# Patient Record
Sex: Male | Born: 2000 | Race: White | Hispanic: No | Smoking: Never smoker
Health system: Southern US, Community
[De-identification: ages and names within clinical notes are randomized; demographics above are authoritative.]

## PROBLEM LIST (undated history)

## (undated) DIAGNOSIS — F909 Attention-deficit hyperactivity disorder, unspecified type: Secondary | ICD-10-CM

## (undated) DIAGNOSIS — IMO0001 Reserved for inherently not codable concepts without codable children: Secondary | ICD-10-CM

## (undated) DIAGNOSIS — Z464 Encounter for fitting and adjustment of orthodontic device: Secondary | ICD-10-CM

## (undated) HISTORY — PX: DENTAL REHABILITATION: SHX1449

## (undated) HISTORY — PX: CIRCUMCISION: SHX1350

---
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
## 2022-07-28 ENCOUNTER — Encounter: Payer: Self-pay | Admitting: Family

## 2022-07-28 ENCOUNTER — Ambulatory Visit (INDEPENDENT_AMBULATORY_CARE_PROVIDER_SITE_OTHER): Payer: Medicaid Other | Admitting: Family

## 2022-07-28 VITALS — BP 110/62 | HR 98 | Resp 18 | Ht 68.0 in | Wt 223.0 lb

## 2022-07-28 DIAGNOSIS — F902 Attention-deficit hyperactivity disorder, combined type: Secondary | ICD-10-CM

## 2022-07-28 DIAGNOSIS — H547 Unspecified visual loss: Secondary | ICD-10-CM | POA: Insufficient documentation

## 2022-07-28 DIAGNOSIS — Z973 Presence of spectacles and contact lenses: Secondary | ICD-10-CM

## 2022-07-28 DIAGNOSIS — Z13220 Encounter for screening for lipoid disorders: Secondary | ICD-10-CM | POA: Diagnosis not present

## 2022-07-28 DIAGNOSIS — Z0000 Encounter for general adult medical examination without abnormal findings: Secondary | ICD-10-CM

## 2022-07-28 LAB — BASIC METABOLIC PANEL
BUN: 10 mg/dL (ref 6–23)
CO2: 30 mEq/L (ref 19–32)
Calcium: 10.3 mg/dL (ref 8.4–10.5)
Chloride: 103 mEq/L (ref 96–112)
Creatinine, Ser: 0.93 mg/dL (ref 0.40–1.50)
GFR: 118.94 mL/min (ref >60.00)
Glucose, Bld: 86 mg/dL (ref 70–99)
Potassium: 4.1 mEq/L (ref 3.5–5.1)
Sodium: 140 mEq/L (ref 135–145)

## 2022-07-28 LAB — LIPID PANEL
Cholesterol: 157 mg/dL (ref 0–200)
HDL: 55.8 mg/dL (ref >39.00)
LDL Cholesterol: 93 mg/dL (ref 0–99)
NonHDL: 101.5
Total CHOL/HDL Ratio: 3
Triglycerides: 45 mg/dL (ref 0.0–149.0)
VLDL: 9 mg/dL (ref 0.0–40.0)

## 2022-07-28 LAB — CBC
HCT: 43.2 % (ref 36.0–49.0)
Hemoglobin: 14.8 g/dL (ref 12.0–16.0)
MCHC: 34.4 g/dL (ref 31.0–37.0)
MCV: 90.3 fl (ref 78.0–98.0)
Platelets: 249 K/uL (ref 150.0–575.0)
RBC: 4.78 Mil/uL (ref 3.80–5.70)
RDW: 13.2 % (ref 11.4–15.5)
WBC: 7.7 K/uL (ref 4.5–13.5)

## 2022-07-28 NOTE — Assessment & Plan Note (Signed)
Maintain annual appt with optometry  Referral placed

## 2022-07-28 NOTE — Assessment & Plan Note (Signed)
Stable controlled without medication per pt

## 2022-07-28 NOTE — Patient Instructions (Addendum)
Welcome to our clinic, I am happy to have you as my new patient. I am excited to continue on this healthcare journey with you.  ------------------------------------ Recommend seeing dentist for regular screenings   ------------------------------------  Stop by the lab prior to leaving today. I will notify you of your results once received.   Please keep in mind Any my chart messages you send have up to a three business day turnaround for a response.  Phone calls may take up to a one full business day turnaround for a  response.   If you need a medication refill I recommend you request it through the pharmacy as this is easiest for [place] rather than sending a message and or phone call.   Due to recent changes in healthcare laws, you may see results of your imaging and/or laboratory studies on MyChart before I have had a chance to review them.  I understand that in some cases there may be results that are confusing or concerning to you. Please understand that not all results are received at the same time and often I may need to interpret multiple results in order to provide you with the best plan of care or course of treatment. Therefore, I ask that you please give me 2 business days to thoroughly review all your results before contacting my office for clarification. Should we see a critical lab result, you will be contacted sooner.   It was a pleasure seeing you today! Please do not hesitate to reach out with any questions and or concerns.  Regards,   Mort Sawyers FNP-C

## 2022-07-28 NOTE — Progress Notes (Signed)
New Patient Office Visit  Subjective:  Patient ID: Brett Freeman, male    DOB: [birth year]  Age: 21 y.o. MRN: [id number]  CC: No chief complaint on file.   HPI Brett Freeman is here to establish care as a new patient.  Prior provider was: Dr. Erma Pinto , pediatrician  Pt is without acute concerns.  . Flu vaccine: declines  Men B: declines  Td up to date.  Dentist, over one year ago, overdue.   chronic concerns:  ADHD: focalin in the past, however not taking anymore. Works as a Furniture conservator/restorer at work, sometimes gets easily distracted however while working. Doesn't seem to affect his job.    H/o visual hallucinations, however decreased dose of focalin and hallucinations  Went away. This was in 2014.   H/o chest pain, ech with normal findings.   Riding in the rodeo,chronic pain from riding, so he smokes marijuana, as been smoking for the last one year. Denies cough.    Past Medical History:  Diagnosis Date   ADHD (attention deficit hyperactivity disorder)    Orthodontics    braces    Past Surgical History:  Procedure Laterality Date   CIRCUMCISION     CLOSED REDUCTION WRIST FRACTURE Right 12/11/2017   Procedure: CLOSED REDUCTION WRIST;  Surgeon: Leim Fabry, MD;  Location: [facility];  Service: Orthopedics;  Laterality: Right;   DENTAL REHABILITATION     age 36 or 58    Family History  Problem Relation Age of Onset   Depression Paternal Uncle    Migraines Paternal Grandfather    Autism Cousin        Maternal 2nd Cousin    Social History   Socioeconomic History   Marital status: Significant Other    Spouse name: Not on file   Number of children: Not on file   Years of education: Not on file   Highest education level: Not on file  Occupational History   Occupation: Furniture conservator/restorer  Tobacco Use   Smoking status: Never    Passive exposure: Yes   Smokeless tobacco: Never  Vaping Use   Vaping Use: Every day   Substances: Nicotine  Substance and  Sexual Activity   Alcohol use: No   Drug use: Yes    Types: Marijuana    Comment: tried mushrooms in the past   Sexual activity: Yes    Partners: Female    Birth control/protection: Coitus interruptus    Comment: engaged, partner has nexplanon  Other Topics Concern   Not on file  Social History Narrative   Not on file   Social Determinants of Health   Financial Resource Strain: Not on file  Food Insecurity: Not on file  Transportation Needs: Not on file  Physical Activity: Not on file  Stress: Not on file  Social Connections: Not on file  Intimate Partner Violence: Not on file    Outpatient Medications Prior to Visit  Medication Sig Dispense Refill   acetaminophen (TYLENOL) 325 MG tablet Take 650 mg by mouth every 6 (six) hours as needed.     dexmethylphenidate (FOCALIN) 10 MG tablet Take 10 mg by mouth daily.     dexmethylphenidate (FOCALIN) 2.5 MG tablet Take 2.5 mg by mouth daily.     ondansetron (ZOFRAN ODT) 4 MG disintegrating tablet Take 1 tablet (4 mg total) by mouth every 8 (eight) hours as needed for nausea or vomiting. 20 tablet 0   No facility-administered medications prior to visit.    No  Known Allergies  ROS Review of Systems  Review of Systems  Respiratory:  Negative for shortness of breath.   Cardiovascular:  Negative for chest pain and palpitations.  Gastrointestinal:  Negative for constipation and diarrhea.  Genitourinary:  Negative for dysuria, frequency and urgency.  Musculoskeletal:  Negative for myalgias.  Psychiatric/Behavioral:  Negative for depression and suicidal ideas.   All other systems reviewed and are negative.    Objective:    Physical Exam  Gen: NAD, resting comfortably CV: RRR with no murmurs appreciated Pulm: NWOB, CTAB with no crackles, wheezes, or rhonchi Skin: warm, dry Psych: Normal affect and thought content  BP 110/62   Pulse 98   Resp 18   Ht _0  (1.727 m)   Wt 223 lb (101.2 kg)   SpO2 95%   BMI 33.91 kg/m   Wt Readings from Last 3 Encounters:  07/28/22 223 lb (101.2 kg) (97 %, Z= 1.93)*  12/11/17 164 lb (74.4 kg) (92 %, Z= 1.40)*  12/08/17 166 lb 0.1 oz (75.3 kg) (93 %, Z= 1.46)*   * Growth percentiles are based on CDC (Boys, 2-20 Years) data.     Health Maintenance Due  Topic Date Due   HIV Screening  Never done   Hepatitis C Screening  Never done    There are no preventive care reminders to display for this patient.   No results found for: "TSH" No results found for: "WBC", "HGB", "HCT", "MCV", "PLT" No results found for: "NA", "K", "CHLORIDE", "CO2", "GLUCOSE", "BUN", "CREATININE", "BILITOT", "ALKPHOS", "AST", "ALT", "PROT", "ALBUMIN", "CALCIUM", "ANIONGAP", "EGFR", "GFR" No results found for: "CHOL" No results found for: "HDL" No results found for: "LDLCALC" No results found for: "TRIG" No results found for: "CHOLHDL" No results found for: "HGBA1C"    Assessment & Plan:   Problem List Items Addressed This Visit       Other   ADHD (attention deficit hyperactivity disorder)    Stable controlled without medication per pt      Visual loss   Relevant Orders   Ambulatory referral to Optometry   Wears glasses - Primary    Maintain annual appt with optometry  Referral placed      Relevant Orders   Ambulatory referral to Optometry   Encounter for general adult medical examination without abnormal findings    Patient Counseling(The following topics were reviewed):  Preventative care handout given to pt  Health maintenance and immunizations reviewed. Please refer to Health maintenance section. Pt advised on safe sex, wearing seatbelts in car, and proper nutrition labwork ordered today for annual Dental health: Discussed importance of regular tooth brushing, flossing, and dental visits.  Recommend dental screenings make appt       Relevant Orders   Lipid panel   Basic metabolic panel   CBC   Other Visit Diagnoses     Lipid screening       Relevant Orders    Lipid panel       No orders of the defined types were placed in this encounter.   Follow-up: Return in about 1 year (around 07/29/2023) for f/u CPE.    Eugenia Pancoast, FNP

## 2022-07-28 NOTE — Assessment & Plan Note (Signed)
Patient Counseling(The following topics were reviewed):  Preventative care handout given to pt  Health maintenance and immunizations reviewed. Please refer to Health maintenance section. Pt advised on safe sex, wearing seatbelts in car, and proper nutrition labwork ordered today for annual Dental health: Discussed importance of regular tooth brushing, flossing, and dental visits.  Recommend dental screenings make appt

## 2022-07-31 NOTE — Progress Notes (Signed)
Cholesterol great, keep up with low cholesterol diet.  No anemia. Labs unremarkable.

## 2022-12-07 ENCOUNTER — Telehealth: Payer: Medicaid Other | Admitting: Physician Assistant

## 2022-12-07 DIAGNOSIS — A084 Viral intestinal infection, unspecified: Secondary | ICD-10-CM

## 2022-12-07 MED ORDER — ONDANSETRON 4 MG PO TBDP: 4.0000 mg | ORAL_TABLET | Freq: Three times a day (TID) | ORAL | 0 refills | Status: DC | PRN

## 2022-12-07 NOTE — Patient Instructions (Signed)
Brett Freeman, thank you for joining [name], PA-C for today's virtual visit.  While this provider is not your primary care provider (PCP), if your PCP is located in our provider database this encounter information will be shared with them immediately following your visit.   [facility] account gives you access to today's visit and all your visits, tests, and labs performed at [facility] " click here if you don't have a [facility] account or go to [email or web address]  Consent: (Patient) Brett Freeman provided verbal consent for this virtual visit at the beginning of the encounter.  Current Medications: No current outpatient medications on file.   Medications ordered in this encounter:  No orders of the defined types were placed in this encounter.    *If you need refills on other medications prior to your next appointment, please contact your pharmacy*  Follow-Up: Call back or seek an in-person evaluation if the symptoms worsen or if the condition fails to improve as anticipated.  [place] [phone]  Other Instructions Please keep hydrated -- sips of water, ginger ale, pedialyte. When you feel like eating, follow diet recommendations below. Ok to use OTC Imodium. The Zofran is to use as directed for nausea/vomiting. If not resolving or any worsening symptoms despite treatment, please seek an in-person evaluation.   Food Choices to Help Relieve Diarrhea, Adult Diarrhea can make you feel weak and cause you to become dehydrated. Dehydration is a condition in which there is not enough water or other fluids in the body. It is important to choose the right foods and drinks to: Relieve diarrhea. Replace lost fluids and nutrients. Prevent dehydration. What are tips for following this plan? Relieving diarrhea Avoid foods that make your diarrhea worse. These may include: Foods and drinks that are sweetened  with high-fructose corn syrup, honey, or sweeteners such as xylitol, sorbitol, and mannitol. Check food labels for these ingredients. Fried, greasy, or spicy foods. Raw fruits and vegetables. Eat foods that are rich in probiotics. These include foods such as yogurt and fermented milk products. Probiotics can help increase healthy bacteria in your stomach and intestines (gastrointestinal or GI tract). This may help digestion and stop diarrhea. If you have lactose intolerance, avoid dairy products. These may make your diarrhea worse. Take medicine to help stop diarrhea only as told by your health care provider. Replacing nutrients  Eat bland, easy-to-digest foods in small amounts as you are able, until your diarrhea starts to get better. These foods include bananas, applesauce, rice, toast, and crackers. Over time, add nutrient-rich foods as your body tolerates them or as told by your health care provider. These include: Well-cooked protein foods, such as eggs, lean meats like fish or chicken without skin, and tofu. Peeled, seeded, and soft-cooked fruits and vegetables. Low-fat dairy products. Whole grains. Take vitamin and mineral supplements as told by your health care provider. Preventing dehydration  Start by sipping water or a solution to prevent dehydration (oral rehydration solution, or ORS). This is a drink that helps replace fluids and minerals your body has lost. You can buy an ORS at pharmacies and retail stores. Try to drink at least 8-10 cups (2,000-2,500 mL) of fluid each day to help replace lost fluids. If your urine is pale yellow, you are getting enough fluids. You may drink other liquids in addition to water, such as fruit juice that you have added water to (diluted fruit juice) or low-calorie sports drinks, as tolerated  or as told by your health care provider. Avoid drinks with caffeine, such as coffee, tea, or soft drinks. Avoid alcohol. This information is not intended to  replace advice given to you by your health care provider. Make sure you discuss any questions you have with your health care provider. Document Revised: 02/14/2022 Document Reviewed: 02/14/2022 Elsevier Patient Education  [place].    If you have been instructed to have an in-person evaluation today at a local Urgent Care facility, please use the link below. It will take you to a list of all of our available [facility], including address, phone number and hours of operation. Please do not delay care.  [facility]  If you or a family member do not have a primary care provider, use the link below to schedule a visit and establish care. When you choose a [facility] primary care physician or advanced practice provider, you gain a long-term partner in health. Find a Primary Care Provider  Learn more about 's in-office and virtual care options: [facility]

## 2022-12-07 NOTE — Progress Notes (Signed)
Virtual Visit Consent   Brett Freeman, you are scheduled for a virtual visit with a [facility] provider today. Just as with appointments in the office, your consent must be obtained to participate. Your consent will be active for this visit and any virtual visit you may have with one of our providers in the next 365 days. If you have a MyChart account, a copy of this consent can be sent to you electronically.  As this is a virtual visit, video technology does not allow for your provider to perform a traditional examination. This may limit your provider's ability to fully assess your condition. If your provider identifies any concerns that need to be evaluated in person or the need to arrange testing (such as labs, EKG, etc.), we will make arrangements to do so. Although advances in technology are sophisticated, we cannot ensure that it will always work on either your end or our end. If the connection with a video visit is poor, the visit may have to be switched to a telephone visit. With either a video or telephone visit, we are not always able to ensure that we have a secure connection.  By engaging in this virtual visit, you consent to the provision of healthcare and authorize for your insurance to be billed (if applicable) for the services provided during this visit. Depending on your insurance coverage, you may receive a charge related to this service.  I need to obtain your verbal consent now. Are you willing to proceed with your visit today? [name] has provided verbal consent on 12/07/2022 for a virtual visit (video or telephone). [name], [place]  Date: 12/07/2022 10:39 AM  Virtual Visit via Video Note   I, [name], connected with  Brett Freeman  ([id number], 2001-05-10) on 12/07/22 at  9:30 AM EDT by a video-enabled telemedicine application and verified that I am speaking with the correct person using two identifiers.  Location: Patient: Virtual Visit  Location Patient: Home Provider: Virtual Visit Location Provider: Home Office   I discussed the limitations of evaluation and management by telemedicine and the availability of in person appointments. The patient expressed understanding and agreed to proceed.    History of Present Illness: Brett Freeman is a 22 y.o. who identifies as a male who was assigned male at birth, and is being seen today for GI symptoms starting this morning with diarrhea, emesis x 2 (non-bloody). Denies melena, hematochezia or tenesmus. Denies fever. Some chills noted. Some mild headache this morning. Felt fine at bedtime last night. Denies recent travel or sick contact.   HPI: HPI  Problems:  Patient Active Problem List   Diagnosis Date Noted   Visual loss 07/28/2022   Wears glasses 07/28/2022   Encounter for general adult medical examination without abnormal findings 07/28/2022   ADHD (attention deficit hyperactivity disorder) 05/21/2013    Allergies: No Known Allergies Medications:  Current Outpatient Medications:    ondansetron (ZOFRAN-ODT) 4 MG disintegrating tablet, Take 1 tablet (4 mg total) by mouth every 8 (eight) hours as needed for nausea or vomiting., Disp: 20 tablet, Rfl: 0  Observations/Objective: Patient is well-developed, well-nourished in no acute distress.  Resting comfortably at home.  Head is normocephalic, atraumatic.  No labored breathing. Speech is clear and coherent with logical content.  Patient is alert and oriented at baseline.  Assessment and Plan: 1. Viral gastroenteritis - ondansetron (ZOFRAN-ODT) 4 MG disintegrating tablet; Take 1 tablet (4 mg total) by mouth every 8 (  eight) hours as needed for nausea or vomiting.  Dispense: 20 tablet; Refill: 0  Supportive measures and OTC medications reviewed. BRAT diet started. Zofran per orders. Ok to start Imodium. Work note provided. Follow-up in person if not resolving. ER precautions reviewed.   Follow Up Instructions: I  discussed the assessment and treatment plan with the patient. The patient was provided an opportunity to ask questions and all were answered. The patient agreed with the plan and demonstrated an understanding of the instructions.  A copy of instructions were sent to the patient via MyChart unless otherwise noted below.   The patient was advised to call back or seek an in-person evaluation if the symptoms worsen or if the condition fails to improve as anticipated.  Time:  I spent 10 minutes with the patient via telehealth technology discussing the above problems/concerns.    [name], PA-C

## 2023-05-04 ENCOUNTER — Encounter: Payer: Self-pay | Admitting: Family Medicine

## 2023-05-04 ENCOUNTER — Ambulatory Visit: Payer: Medicaid Other | Admitting: Family Medicine

## 2023-05-04 VITALS — BP 100/60 | HR 74 | Temp 98.1°F | Ht 68.5 in | Wt 230.5 lb

## 2023-05-04 DIAGNOSIS — M545 Low back pain, unspecified: Secondary | ICD-10-CM

## 2023-05-04 MED ORDER — CYCLOBENZAPRINE HCL 10 MG PO TABS: 10.0000 mg | ORAL_TABLET | Freq: Every evening | ORAL | 0 refills | Status: AC | PRN

## 2023-05-04 MED ORDER — DICLOFENAC SODIUM 75 MG PO TBEC: 75.0000 mg | DELAYED_RELEASE_TABLET | Freq: Two times a day (BID) | ORAL | 0 refills | Status: DC

## 2023-05-04 NOTE — Assessment & Plan Note (Signed)
Acute, paraspinous muscle strain No red flags or indication for imaging. Treat with heat, home physical therapy, massage.  Start anti-inflammatory diclofenac 75 mg p.o. twice daily.  Use muscle relaxant Flexeril 10 mg p.o. nightly as needed muscle spasm.  Return and ER precautions provided.  Follow-up if not improving as expected after 2 to 4 weeks. Encouraged strengthening of core to prevent further back issues.

## 2023-05-04 NOTE — Patient Instructions (Signed)
Heat and  massage on low/ mid back  Start diclofenac twice daily for inflammation. Can use muscle relaxant at night for spasm.  Start home physical therapy

## 2023-05-04 NOTE — Progress Notes (Signed)
Patient ID: JUMAANE DEMERY, male    DOB: 2001/07/07, 22 y.o.   MRN: [id number]  This visit was conducted in person.  BP 100/60 (BP Location: Left Arm, Patient Position: Sitting, Cuff Size: Large)   Pulse 74   Temp 98.1 F (36.7 C) (Temporal)   Ht 5' 8.5" (1.74 m)   Wt 230 lb 8 oz (104.6 kg)   SpO2 99%   BMI 34.54 kg/m    CC:  Chief Complaint  Patient presents with   Back Pain    Lower-Started on Sunday    Subjective:   HPI: Brett Freeman is a 22 y.o. male presenting on 05/04/2023 for Back Pain (Lower-Started on Sunday)  New onset low back pain x 5 days.  Woke up with  low back pain in middle.  Radiation up back no radiation to legs.  No fever, no dysuria.  Pain worse with standing up straight. Pain 8/10 on pain scale.  No numbness, no weakness in legs.   At worse standing on concrete all day. No falls. No  known injury.  Using aleve 2 a day, heating pad.. helped some. Applied topical lidocaine.   \No history of back issues or surgeries.  Relevant past medical, surgical, family and social history reviewed and updated as indicated. Interim medical history since our last visit reviewed. Allergies and medications reviewed and updated. Outpatient Medications Prior to Visit  Medication Sig Dispense Refill   ondansetron (ZOFRAN-ODT) 4 MG disintegrating tablet Take 1 tablet (4 mg total) by mouth every 8 (eight) hours as needed for nausea or vomiting. 20 tablet 0   No facility-administered medications prior to visit.     Per HPI unless specifically indicated in ROS section below Review of Systems  Constitutional:  Negative for fatigue and fever.  HENT:  Negative for ear pain.   Eyes:  Negative for pain.  Respiratory:  Negative for cough and shortness of breath.   Cardiovascular:  Negative for chest pain, palpitations and leg swelling.  Gastrointestinal:  Negative for abdominal pain.  Genitourinary:  Negative for dysuria.  Musculoskeletal:  Positive for back  pain. Negative for arthralgias.  Neurological:  Negative for syncope, light-headedness and headaches.  Psychiatric/Behavioral:  Negative for dysphoric mood.    Objective:  BP 100/60 (BP Location: Left Arm, Patient Position: Sitting, Cuff Size: Large)   Pulse 74   Temp 98.1 F (36.7 C) (Temporal)   Ht 5' 8.5" (1.74 m)   Wt 230 lb 8 oz (104.6 kg)   SpO2 99%   BMI 34.54 kg/m   Wt Readings from Last 3 Encounters:  05/04/23 230 lb 8 oz (104.6 kg)  07/28/22 223 lb (101.2 kg) (97%, Z= 1.93)*  12/11/17 164 lb (74.4 kg) (92%, Z= 1.40)*   * Growth percentiles are based on CDC (Boys, 2-20 Years) data.      Physical Exam Constitutional:      Appearance: He is well-developed.  HENT:     Head: Normocephalic.     Right Ear: Hearing normal.     Left Ear: Hearing normal.     Nose: Nose normal.  Neck:     Thyroid: No thyroid mass or thyromegaly.     Vascular: No carotid bruit.     Trachea: Trachea normal.  Cardiovascular:     Rate and Rhythm: Normal rate and regular rhythm.     Pulses: Normal pulses.     Heart sounds: Heart sounds not distant. No murmur heard.    No friction rub.  No gallop.     Comments: No peripheral edema Pulmonary:     Effort: Pulmonary effort is normal. No respiratory distress.     Breath sounds: Normal breath sounds.  Musculoskeletal:     Cervical back: Normal.     Thoracic back: Spasms and tenderness present. No bony tenderness. Decreased range of motion. No scoliosis.     Lumbar back: Spasms and tenderness present. No swelling or bony tenderness. Decreased range of motion. Negative right straight leg raise test and negative left straight leg raise test.  Skin:    General: Skin is warm and dry.     Findings: No rash.  Neurological:     Mental Status: He is alert and oriented to person, place, and time.     Cranial Nerves: Cranial nerves 2-12 are intact.     Sensory: Sensation is intact.     Motor: Motor function is intact.     Coordination: Coordination is  intact.  Psychiatric:        Speech: Speech normal.        Behavior: Behavior normal.        Thought Content: Thought content normal.       Results for orders placed or performed in visit on 07/28/22  Lipid panel  Result Value Ref Range   Cholesterol 157 0 - 200 mg/dL   Triglycerides 91.4 0.0 - 149.0 mg/dL   HDL 78.29 >56.21 mg/dL   VLDL 9.0 0.0 - 30.8 mg/dL   LDL Cholesterol 93 0 - 99 mg/dL   Total CHOL/HDL Ratio 3    NonHDL 101.50   Basic metabolic panel  Result Value Ref Range   Sodium 140 135 - 145 mEq/L   Potassium 4.1 3.5 - 5.1 mEq/L   Chloride 103 96 - 112 mEq/L   CO2 30 19 - 32 mEq/L   Glucose, Bld 86 70 - 99 mg/dL   BUN 10 6 - 23 mg/dL   Creatinine, Ser 6.57 0.40 - 1.50 mg/dL   GFR 846.96 >29.52 mL/min   Calcium 10.3 8.4 - 10.5 mg/dL  CBC  Result Value Ref Range   WBC 7.7 4.5 - 13.5 K/uL   RBC 4.78 3.80 - 5.70 Mil/uL   Platelets 249.0 150.0 - 575.0 K/uL   Hemoglobin 14.8 12.0 - 16.0 g/dL   HCT 84.1 32.4 - 40.1 %   MCV 90.3 78.0 - 98.0 fl   MCHC 34.4 31.0 - 37.0 g/dL   RDW 02.7 25.3 - 66.4 %    Assessment and Plan  Acute bilateral low back pain without sciatica Assessment & Plan: Acute, paraspinous muscle strain No red flags or indication for imaging. Treat with heat, home physical therapy, massage.  Start anti-inflammatory diclofenac 75 mg p.o. twice daily.  Use muscle relaxant Flexeril 10 mg p.o. nightly as needed muscle spasm.  Return and ER precautions provided.  Follow-up if not improving as expected after 2 to 4 weeks. Encouraged strengthening of core to prevent further back issues.   Other orders -     Cyclobenzaprine HCl; Take 1 tablet (10 mg total) by mouth at bedtime as needed for muscle spasms.  Dispense: 15 tablet; Refill: 0 -     Diclofenac Sodium; Take 1 tablet (75 mg total) by mouth 2 (two) times daily.  Dispense: 30 tablet; Refill: 0    No follow-ups on file.   Kerby Nora, MD

## 2023-05-10 ENCOUNTER — Telehealth: Payer: Self-pay

## 2023-05-10 NOTE — Telephone Encounter (Signed)
*  Primary  Pharmacy Patient Advocate Encounter   Received notification from CoverMyMeds that prior authorization for Diclofenac Sodium 75MG  dr tablets  is required/requested.   Insurance verification completed.   The patient is insured through  [employer]  .   Per test claim: PA required; PA submitted to [employer] via CoverMyMeds Key/confirmation #/EOC [id number] Status is pending

## 2023-05-11 MED ORDER — DICLOFENAC SODIUM 75 MG PO TBEC: 75.0000 mg | DELAYED_RELEASE_TABLET | Freq: Two times a day (BID) | ORAL | Status: AC

## 2023-05-11 MED ORDER — MELOXICAM 15 MG PO TABS: 15.0000 mg | ORAL_TABLET | Freq: Every day | ORAL | 0 refills | Status: DC

## 2023-05-11 NOTE — Telephone Encounter (Signed)
Diclofenac not covered by pharmacy appears they do cover meloxicam. I will send this send to his pharmacy

## 2023-05-11 NOTE — Telephone Encounter (Signed)
Pharmacy Patient Advocate Encounter  Received notification from  [employer]  that Prior Authorization for Diclofenac Sodium 75MG  dr tablets  has been DENIED.  Full denial letter will be uploaded to the media tab. See denial reason below.  Denied. Per the health plan's preferred drug list, at least 2 preferred drugs must be tried before requesting this drug or tell us why the member cannot try any preferred alternatives. Please send [place] supporting chart notes and lab results. Here is list of preferred alternatives: celecoxib; ibuprofen; indomethacin; ketorolac; meloxicam; sulindac. Per our records, the member has already tried naproxen. Note: Some preferred drug(s) may have quantity limits. Refer to the health plan's preferred drug list for additional details.

## 2023-05-11 NOTE — Addendum Note (Signed)
Addended by: Kerby Nora E on: 05/11/2023 01:25 PM   Modules accepted: Orders

## 2023-05-11 NOTE — Telephone Encounter (Signed)
Spoke with [facility] and advised his insurance denied coverage of the diclofenac.  He states he used a Good Rx coupon and was able to picked it up.  I call [facility] and cancelled Meloxicam Rx.   Medication list updated.

## 2023-05-11 NOTE — Addendum Note (Signed)
Addended by: Damita Lack on: 05/11/2023 02:06 PM   Modules accepted: Orders
